# Patient Record
Sex: Male | Born: 1955 | Race: White | Hispanic: No | Smoking: Current every day smoker
Health system: Southern US, Community
[De-identification: ages and names within clinical notes are randomized; demographics above are authoritative.]

## PROBLEM LIST (undated history)

## (undated) DIAGNOSIS — I10 Essential (primary) hypertension: Secondary | ICD-10-CM

## (undated) DIAGNOSIS — R55 Syncope and collapse: Secondary | ICD-10-CM

## (undated) DIAGNOSIS — F1010 Alcohol abuse, uncomplicated: Secondary | ICD-10-CM

## (undated) DIAGNOSIS — E785 Hyperlipidemia, unspecified: Secondary | ICD-10-CM

## (undated) DIAGNOSIS — R569 Unspecified convulsions: Secondary | ICD-10-CM

## (undated) DIAGNOSIS — B019 Varicella without complication: Secondary | ICD-10-CM

## (undated) HISTORY — DX: Hyperlipidemia, unspecified: E78.5

## (undated) HISTORY — DX: Syncope and collapse: R55

## (undated) HISTORY — DX: Varicella without complication: B01.9

## (undated) HISTORY — DX: Essential (primary) hypertension: I10

## (undated) HISTORY — DX: Unspecified convulsions: R56.9

## (undated) HISTORY — DX: Alcohol abuse, uncomplicated: F10.10

---

## 2007-06-03 ENCOUNTER — Other Ambulatory Visit (HOSPITAL_COMMUNITY): Admission: RE | Admit: 2007-06-03 | Discharge: 2007-06-25 | Payer: Self-pay | Admitting: Psychiatry

## 2007-06-04 ENCOUNTER — Ambulatory Visit: Payer: Self-pay | Admitting: Psychiatry

## 2008-01-11 ENCOUNTER — Encounter: Admission: RE | Admit: 2008-01-11 | Discharge: 2008-01-11 | Payer: Self-pay | Admitting: Neurology

## 2008-09-30 ENCOUNTER — Emergency Department (HOSPITAL_COMMUNITY): Admission: EM | Admit: 2008-09-30 | Discharge: 2008-09-30 | Payer: Self-pay | Admitting: Emergency Medicine

## 2008-11-22 ENCOUNTER — Emergency Department (HOSPITAL_COMMUNITY): Admission: EM | Admit: 2008-11-22 | Discharge: 2008-11-22 | Payer: Self-pay | Admitting: Emergency Medicine

## 2009-04-19 ENCOUNTER — Ambulatory Visit (HOSPITAL_COMMUNITY): Admission: RE | Admit: 2009-04-19 | Discharge: 2009-04-20 | Payer: Self-pay | Admitting: Orthopedic Surgery

## 2009-07-14 ENCOUNTER — Ambulatory Visit (HOSPITAL_COMMUNITY): Admission: RE | Admit: 2009-07-14 | Discharge: 2009-07-14 | Payer: Self-pay | Admitting: Orthopedic Surgery

## 2009-09-06 ENCOUNTER — Emergency Department (HOSPITAL_COMMUNITY): Admission: EM | Admit: 2009-09-06 | Discharge: 2009-09-07 | Payer: Self-pay | Admitting: Emergency Medicine

## 2009-09-10 ENCOUNTER — Emergency Department (HOSPITAL_COMMUNITY): Admission: EM | Admit: 2009-09-10 | Discharge: 2009-09-11 | Payer: Self-pay | Admitting: Emergency Medicine

## 2010-08-31 IMAGING — RF DG FOOT 2V*R*
1 series · 2 of 2 positions shown · non-contrast
Comparison: None

CLINICAL DATA: Less frank fracture

RIGHT FOOT - 2 VIEW

[Series 1: run · 2 of 2 slices shown]
[im 1/2]
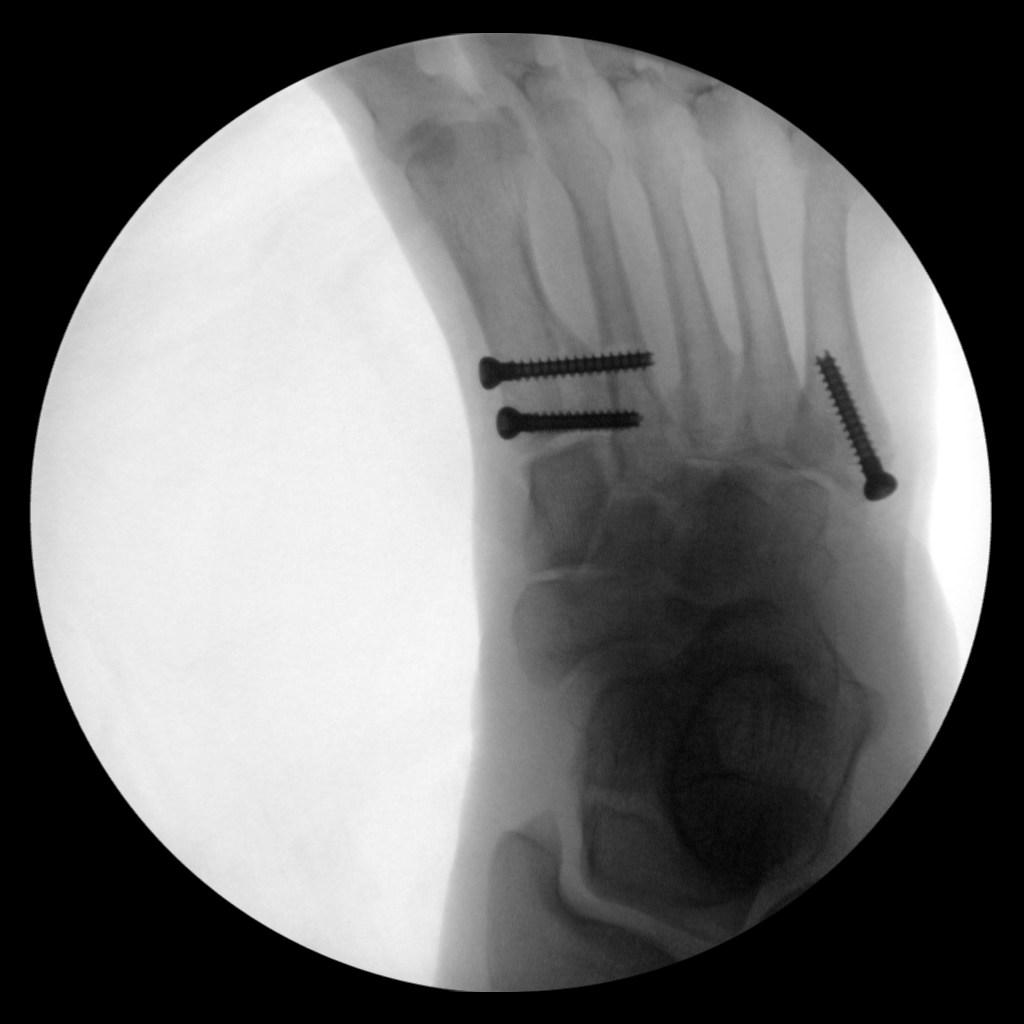
[im 2/2]
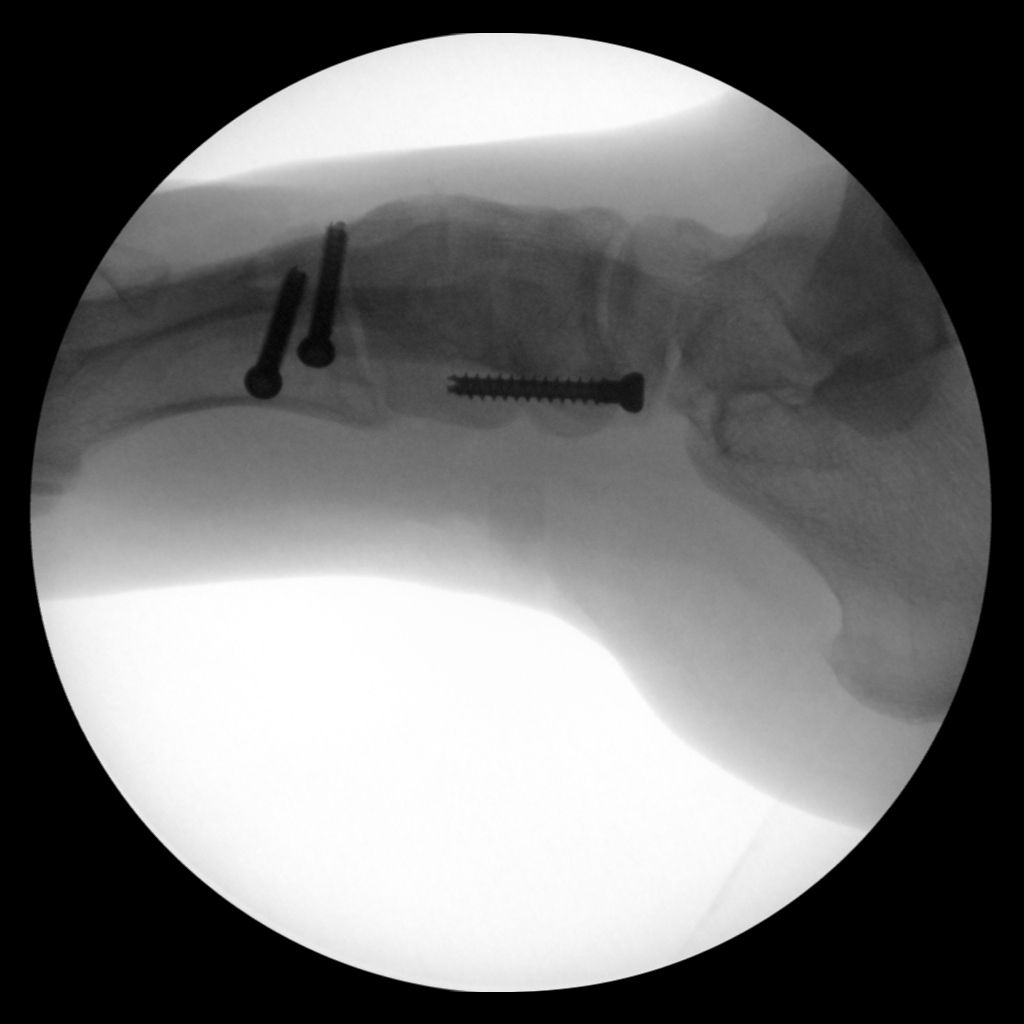

[2 of 2 positions shown; findings below may reference images not displayed]

FINDINGS: There are fixation screws spanning the first and second
metatarsal from medial to lateral.  A third screw enters the base
of the fifth metatarsal.  No evidence of dislocation.
IMPRESSION: Internal screw fixation of less frank fracture dislocation.

## 2010-10-24 LAB — COMPREHENSIVE METABOLIC PANEL
AST: 43 U/L — ABNORMAL HIGH (ref 0–37)
Albumin: 4.5 g/dL (ref 3.5–5.2)
Alkaline Phosphatase: 63 U/L (ref 39–117)
BUN: 8 mg/dL (ref 6–23)
CO2: 24 mEq/L (ref 19–32)
Calcium: 8.3 mg/dL — ABNORMAL LOW (ref 8.4–10.5)
Chloride: 98 mEq/L (ref 96–112)
Creatinine, Ser: 0.57 mg/dL (ref 0.4–1.5)
GFR calc Af Amer: >60 mL/min (ref >60)
GFR calc non Af Amer: >60 mL/min (ref >60)
Glucose, Bld: 102 mg/dL — ABNORMAL HIGH (ref 70–99)
Potassium: 3.2 mEq/L — ABNORMAL LOW (ref 3.5–5.1)
Sodium: 139 mEq/L (ref 135–145)
Total Bilirubin: 1.1 mg/dL (ref 0.3–1.2)
Total Protein: 6.6 g/dL (ref 6.0–8.3)
Total Protein: 8.2 g/dL (ref 6.0–8.3)

## 2010-10-24 LAB — DIFFERENTIAL
Basophils Absolute: 0.1 10*3/uL (ref 0.0–0.1)
Basophils Relative: 1 % (ref 0–1)
Eosinophils Relative: 0 % (ref 0–5)
Lymphocytes Relative: 20 % (ref 12–46)
Lymphocytes Relative: 22 % (ref 12–46)
Lymphs Abs: 1.1 10*3/uL (ref 0.7–4.0)
Monocytes Absolute: 0.6 10*3/uL (ref 0.1–1.0)
Monocytes Relative: 5 % (ref 3–12)
Monocytes Relative: 6 % (ref 3–12)
Neutro Abs: 4.1 10*3/uL (ref 1.7–7.7)
Neutro Abs: 7.1 10*3/uL (ref 1.7–7.7)
Neutrophils Relative %: 74 % (ref 43–77)

## 2010-10-24 LAB — RAPID URINE DRUG SCREEN, HOSP PERFORMED
Amphetamines: NOT DETECTED
Amphetamines: NOT DETECTED
Barbiturates: NOT DETECTED
Barbiturates: NOT DETECTED
Benzodiazepines: NOT DETECTED
Benzodiazepines: POSITIVE — AB
Cocaine: NOT DETECTED
Opiates: NOT DETECTED
Tetrahydrocannabinol: NOT DETECTED
Tetrahydrocannabinol: NOT DETECTED

## 2010-10-24 LAB — CBC
Hemoglobin: 15.2 g/dL (ref 13.0–17.0)
MCHC: 35 g/dL (ref 30.0–36.0)
MCV: 95.1 fL (ref 78.0–100.0)
Platelets: 226 10*3/uL (ref 150–400)
RBC: 4.57 MIL/uL (ref 4.22–5.81)
RDW: 14.8 % (ref 11.5–15.5)
RDW: 15.3 % (ref 11.5–15.5)
WBC: 10 10*3/uL (ref 4.0–10.5)

## 2010-10-24 LAB — POCT I-STAT, CHEM 8
Calcium, Ion: 1.02 mmol/L — ABNORMAL LOW (ref 1.12–1.32)
Chloride: 106 mEq/L (ref 96–112)
Glucose, Bld: 107 mg/dL — ABNORMAL HIGH (ref 70–99)
HCT: 45 % (ref 39.0–52.0)
Hemoglobin: 15.3 g/dL (ref 13.0–17.0)
TCO2: 29 mmol/L (ref 0–100)

## 2010-10-24 LAB — ACETAMINOPHEN LEVEL: Acetaminophen (Tylenol), Serum: <10 ug/mL — ABNORMAL LOW (ref 10–30)

## 2010-10-24 LAB — ETHANOL: Alcohol, Ethyl (B): 117 mg/dL — ABNORMAL HIGH (ref 0–10)

## 2010-11-09 LAB — BASIC METABOLIC PANEL
BUN: 10 mg/dL (ref 6–23)
Creatinine, Ser: 0.7 mg/dL (ref 0.4–1.5)
GFR calc non Af Amer: >60 mL/min (ref >60)
Glucose, Bld: 96 mg/dL (ref 70–99)
Potassium: 3.8 mEq/L (ref 3.5–5.1)

## 2010-11-09 LAB — CBC
HCT: 38.7 % — ABNORMAL LOW (ref 39.0–52.0)
Platelets: 197 10*3/uL (ref 150–400)
RDW: 13.5 % (ref 11.5–15.5)

## 2010-11-09 LAB — POCT I-STAT 4, (NA,K, GLUC, HGB,HCT)
Glucose, Bld: 109 mg/dL — ABNORMAL HIGH (ref 70–99)
Sodium: 138 mEq/L (ref 135–145)

## 2010-11-20 LAB — URINALYSIS, ROUTINE W REFLEX MICROSCOPIC
Glucose, UA: NEGATIVE mg/dL
Hgb urine dipstick: NEGATIVE
Specific Gravity, Urine: 1.02 (ref 1.005–1.030)

## 2010-11-20 LAB — COMPREHENSIVE METABOLIC PANEL
AST: 35 U/L (ref 0–37)
Albumin: 3.9 g/dL (ref 3.5–5.2)
Alkaline Phosphatase: 42 U/L (ref 39–117)
BUN: 13 mg/dL (ref 6–23)
GFR calc Af Amer: >60 mL/min (ref >60)
Potassium: 4.1 mEq/L (ref 3.5–5.1)
Sodium: 140 mEq/L (ref 135–145)
Total Protein: 6.6 g/dL (ref 6.0–8.3)

## 2010-11-20 LAB — CBC
HCT: 45.7 % (ref 39.0–52.0)
Hemoglobin: 15.4 g/dL (ref 13.0–17.0)
MCHC: 33.8 g/dL (ref 30.0–36.0)
MCV: 98.5 fL (ref 78.0–100.0)
RBC: 4.64 MIL/uL (ref 4.22–5.81)

## 2010-11-20 LAB — DIFFERENTIAL
Basophils Relative: 1 % (ref 0–1)
Eosinophils Relative: 1 % (ref 0–5)
Monocytes Absolute: 0.3 10*3/uL (ref 0.1–1.0)
Monocytes Relative: 4 % (ref 3–12)
Neutro Abs: 4.7 10*3/uL (ref 1.7–7.7)

## 2010-11-20 LAB — RAPID URINE DRUG SCREEN, HOSP PERFORMED
Barbiturates: NOT DETECTED
Opiates: NOT DETECTED

## 2010-11-20 LAB — URINE MICROSCOPIC-ADD ON

## 2010-12-17 ENCOUNTER — Encounter: Payer: Self-pay | Admitting: Family Medicine

## 2010-12-17 ENCOUNTER — Ambulatory Visit (INDEPENDENT_AMBULATORY_CARE_PROVIDER_SITE_OTHER): Payer: BC Managed Care – PPO | Admitting: Family Medicine

## 2010-12-17 DIAGNOSIS — M7580 Other shoulder lesions, unspecified shoulder: Secondary | ICD-10-CM

## 2010-12-17 DIAGNOSIS — I10 Essential (primary) hypertension: Secondary | ICD-10-CM

## 2010-12-17 DIAGNOSIS — E785 Hyperlipidemia, unspecified: Secondary | ICD-10-CM | POA: Insufficient documentation

## 2010-12-17 DIAGNOSIS — M719 Bursopathy, unspecified: Secondary | ICD-10-CM

## 2010-12-17 DIAGNOSIS — F1020 Alcohol dependence, uncomplicated: Secondary | ICD-10-CM | POA: Insufficient documentation

## 2010-12-17 NOTE — Patient Instructions (Signed)
Consider icing shoulder 2-3 times daily. Work on range of motion stretches, but no heavy lifting. Touch base in 2 weeks if no better.

## 2010-12-17 NOTE — Progress Notes (Signed)
  Subjective:    Patient ID: James Leblanc, male    DOB: [birth year], 55 y.o.   MRN: [id number]  HPI New patient to establish care. History of alcoholism. Recent 14 month inpatient treatment.    Discharged 2 weeks ago. He is currently back staying with parents. Other medical history significant for prior elevated blood pressure but not treated recently for hypertension. History of hyperlipidemia. History of petit mal seizures in childhood but no adult seizures.  Currently takes no medications. No prior surgeries. No known drug allergy.  Acute issue is right shoulder pain past 5 months. No injury. Did some weight lifting during his rehabilitation. Pain with abduction. No neck pain. No weakness. Minimal if any night pain. Ibuprofen with minimal relief. Did have someone in rehabilitation inject his shoulder several months ago without much improvement.   Review of Systems  Constitutional: Negative for fever, chills, activity change and appetite change.  Respiratory: Negative for cough and shortness of breath.   Cardiovascular: Negative for chest pain and palpitations.  Gastrointestinal: Negative for abdominal pain.  Genitourinary: Negative for dysuria.  Musculoskeletal:       [Right shoulder pain as per history of present illness Neurological: Negative for dizziness, syncope and weakness.  Psychiatric/Behavioral: Negative for dysphoric mood and agitation.       Objective:   Physical Exam  Constitutional: He appears well-developed and well-nourished.  HENT:  Mouth/Throat: Oropharynx is clear and moist. No oropharyngeal exudate.  Eyes: Pupils are equal, round, and reactive to light.  Neck: Neck supple. No thyromegaly present.  Cardiovascular: Normal rate, regular rhythm and normal heart sounds.   Pulmonary/Chest: Effort normal and breath sounds normal. No respiratory distress. He has no wheezes. He has no rales.  Musculoskeletal:       Right shoulder reveals no muscle atrophy. Full range  of motion. No a.c. joint tenderness. No biceps tenderness. Pain with abduction against resistance. No pain with internal rotation. No significant weakness with rotator cuff testing  Lymphadenopathy:    He has no cervical adenopathy.  Psychiatric: He has a normal mood and affect.          Assessment & Plan:  #1 right shoulder pain. Suspect rotator cuff tendinitis. Discussed risk and benefits of corticosteroid injection and patient consented. Prepped right shoulder with Betadine. Using sterile technique injected 40 mg Depo-Medrol and 2 cc plain Xylocaine using posterior lateral approach. Patient tolerated well some immediate relief of pain afterwards. Instructed in range of motion exercises. Consider MRI or orthopedic referral if no better 2 to 3 weeks #2 history of alcohol abuse. Inpatient rehabilitation as above. Offer ongoing support. #3 hx of hyperlipidemia.

## 2011-05-14 LAB — URINE DRUGS OF ABUSE SCREEN W ALC, ROUTINE (REF LAB)
Amphetamine Screen, Ur: NEGATIVE
Amphetamine Screen, Ur: NEGATIVE
Amphetamine Screen, Ur: NEGATIVE
Cocaine Metabolites: NEGATIVE
Marijuana Metabolite: NEGATIVE
Marijuana Metabolite: NEGATIVE
Marijuana Metabolite: NEGATIVE
Methadone: NEGATIVE
Methadone: NEGATIVE
Methadone: NEGATIVE
Propoxyphene: NEGATIVE
Propoxyphene: NEGATIVE
Propoxyphene: NEGATIVE

## 2018-10-06 ENCOUNTER — Emergency Department (HOSPITAL_COMMUNITY): Payer: Self-pay

## 2018-10-06 ENCOUNTER — Observation Stay (HOSPITAL_COMMUNITY)
Admission: EM | Admit: 2018-10-06 | Discharge: 2018-10-07 | Disposition: A | Discharge status: Home / Self Care | Payer: Self-pay | Attending: Internal Medicine | Admitting: Internal Medicine

## 2018-10-06 ENCOUNTER — Encounter (HOSPITAL_COMMUNITY): Payer: Self-pay | Admitting: Emergency Medicine

## 2018-10-06 ENCOUNTER — Other Ambulatory Visit: Payer: Self-pay

## 2018-10-06 DIAGNOSIS — Z8249 Family history of ischemic heart disease and other diseases of the circulatory system: Secondary | ICD-10-CM | POA: Insufficient documentation

## 2018-10-06 DIAGNOSIS — Z79899 Other long term (current) drug therapy: Secondary | ICD-10-CM | POA: Insufficient documentation

## 2018-10-06 DIAGNOSIS — J9801 Acute bronchospasm: Secondary | ICD-10-CM | POA: Insufficient documentation

## 2018-10-06 DIAGNOSIS — X58XXXA Exposure to other specified factors, initial encounter: Secondary | ICD-10-CM | POA: Insufficient documentation

## 2018-10-06 DIAGNOSIS — E785 Hyperlipidemia, unspecified: Secondary | ICD-10-CM | POA: Insufficient documentation

## 2018-10-06 DIAGNOSIS — J40 Bronchitis, not specified as acute or chronic: Secondary | ICD-10-CM | POA: Insufficient documentation

## 2018-10-06 DIAGNOSIS — I10 Essential (primary) hypertension: Secondary | ICD-10-CM | POA: Insufficient documentation

## 2018-10-06 DIAGNOSIS — J209 Acute bronchitis, unspecified: Secondary | ICD-10-CM | POA: Diagnosis present

## 2018-10-06 DIAGNOSIS — E876 Hypokalemia: Secondary | ICD-10-CM | POA: Diagnosis present

## 2018-10-06 DIAGNOSIS — J90 Pleural effusion, not elsewhere classified: Secondary | ICD-10-CM | POA: Insufficient documentation

## 2018-10-06 DIAGNOSIS — F17210 Nicotine dependence, cigarettes, uncomplicated: Secondary | ICD-10-CM | POA: Insufficient documentation

## 2018-10-06 DIAGNOSIS — R0602 Shortness of breath: Secondary | ICD-10-CM

## 2018-10-06 DIAGNOSIS — J189 Pneumonia, unspecified organism: Principal | ICD-10-CM | POA: Insufficient documentation

## 2018-10-06 DIAGNOSIS — S2231XA Fracture of one rib, right side, initial encounter for closed fracture: Secondary | ICD-10-CM | POA: Diagnosis present

## 2018-10-06 LAB — CBC WITH DIFFERENTIAL/PLATELET
ABS IMMATURE GRANULOCYTES: 0.23 10*3/uL — AB (ref 0.00–0.07)
Basophils Absolute: 0.1 10*3/uL (ref 0.0–0.1)
Basophils Relative: 0 %
EOS PCT: 0 %
Eosinophils Absolute: 0.1 10*3/uL (ref 0.0–0.5)
HEMATOCRIT: 42.8 % (ref 39.0–52.0)
HEMOGLOBIN: 14.1 g/dL (ref 13.0–17.0)
Immature Granulocytes: 1 %
LYMPHS ABS: 1.9 10*3/uL (ref 0.7–4.0)
LYMPHS PCT: 9 %
MCH: 29.5 pg (ref 26.0–34.0)
MCHC: 32.9 g/dL (ref 30.0–36.0)
MCV: 89.5 fL (ref 80.0–100.0)
MONO ABS: 1 10*3/uL (ref 0.1–1.0)
Monocytes Relative: 5 %
NEUTROS ABS: 19 10*3/uL — AB (ref 1.7–7.7)
Neutrophils Relative %: 85 %
Platelets: 325 10*3/uL (ref 150–400)
RBC: 4.78 MIL/uL (ref 4.22–5.81)
RDW: 12.4 % (ref 11.5–15.5)
WBC: 22.3 10*3/uL — ABNORMAL HIGH (ref 4.0–10.5)
nRBC: 0 % (ref 0.0–0.2)

## 2018-10-06 LAB — COMPREHENSIVE METABOLIC PANEL
ALBUMIN: 3.8 g/dL (ref 3.5–5.0)
ALK PHOS: 53 U/L (ref 38–126)
ALT: 30 U/L (ref 0–44)
ANION GAP: 10 (ref 5–15)
AST: 31 U/L (ref 15–41)
BILIRUBIN TOTAL: 0.4 mg/dL (ref 0.3–1.2)
BUN: 12 mg/dL (ref 8–23)
CALCIUM: 8.4 mg/dL — AB (ref 8.9–10.3)
CO2: 21 mmol/L — ABNORMAL LOW (ref 22–32)
CREATININE: 0.7 mg/dL (ref 0.61–1.24)
Chloride: 106 mmol/L (ref 98–111)
GFR calc Af Amer: >60 mL/min (ref >60)
GFR calc non Af Amer: >60 mL/min (ref >60)
GLUCOSE: 153 mg/dL — AB (ref 70–99)
Potassium: 3.3 mmol/L — ABNORMAL LOW (ref 3.5–5.1)
SODIUM: 137 mmol/L (ref 135–145)
TOTAL PROTEIN: 7.6 g/dL (ref 6.5–8.1)

## 2018-10-06 LAB — LACTIC ACID, PLASMA: LACTIC ACID, VENOUS: 1.3 mmol/L (ref 0.5–1.9)

## 2018-10-06 LAB — INFLUENZA PANEL BY PCR (TYPE A & B)
INFLAPCR: NEGATIVE
INFLBPCR: NEGATIVE

## 2018-10-06 MED ORDER — SODIUM CHLORIDE 0.9 % IV SOLN
INTRAVENOUS | Status: DC | PRN
  Administered 2018-10-06: via INTRAVENOUS

## 2018-10-06 MED ORDER — OXYCODONE-ACETAMINOPHEN 5-325 MG PO TABS
1.0000 | ORAL_TABLET | Freq: Once | ORAL | Status: AC
  Administered 2018-10-06: 1 via ORAL
  Filled 2018-10-06: qty 1

## 2018-10-06 MED ORDER — LEVOFLOXACIN IN D5W 750 MG/150ML IV SOLN
750.0000 mg | Freq: Once | INTRAVENOUS | Status: AC
  Administered 2018-10-06: 750 mg via INTRAVENOUS
  Filled 2018-10-06: qty 150

## 2018-10-06 MED ORDER — ALBUTEROL SULFATE (2.5 MG/3ML) 0.083% IN NEBU
5.0000 mg | INHALATION_SOLUTION | Freq: Once | RESPIRATORY_TRACT | Status: AC
  Administered 2018-10-06: 5 mg via RESPIRATORY_TRACT
  Filled 2018-10-06: qty 6

## 2018-10-06 NOTE — ED Triage Notes (Signed)
Per EMS pt was coughing tonight when he felt "a pop in is right side." Pt given 6mg  morphine en route via [facility].

## 2018-10-06 NOTE — ED Provider Notes (Signed)
[facility] EMERGENCY DEPARTMENT Provider Note   CSN: [id number] Arrival date & time: 10/06/18  2112    History   Chief Complaint Chief Complaint  Patient presents with  . Rib Pain    HPI James Leblanc is a 63 y.o. male.     HPI With history of several weeks of nonproductive cough.  No fever or chills.  States he began to have acute onset right-sided chest pain after coughing this evening.  Was having difficulty catching his breath.  States he occasionally produces small amount of white sputum.  Right-sided chest pain is worse with deep breathing, coughing and movement.  He has had no new lower extremity swelling or pain.  No recent extended travel or immobilization. Past Medical History:  Diagnosis Date  . Alcohol abuse   . Chicken pox   . Fainting   . Hyperlipidemia   . Hypertension   . Seizures [facility])     Patient Active Problem List   Diagnosis Date Noted  . CAP (community acquired pneumonia) 10/07/2018  . Alcoholism (HCC) 12/17/2010  . Hypertension 12/17/2010  . Hyperlipemia 12/17/2010    History reviewed. No pertinent surgical history.      Home Medications    Prior to Admission medications   Medication Sig Start Date End Date Taking? Authorizing Provider  acetaminophen (TYLENOL) 500 MG tablet Take 500 mg by mouth every 6 (six) hours as needed for mild pain or moderate pain.   Yes [provider]  dextromethorphan-guaiFENesin (ROBITUSSIN COLD COUGH+ CHEST) 10-100 MG/5ML liquid Take 5 mLs by mouth every 4 (four) hours as needed for cough.   Yes [provider]  DM-Doxylamine-Acetaminophen (NYQUIL COLD & FLU PO) Take 15 mLs by mouth at bedtime as needed (for sleep-cough).   Yes [provider]  fish oil-omega-3 fatty acids 1000 MG capsule Take 2 g by mouth daily.     Yes [provider]  glucosamine-chondroitin 500-400 MG tablet Take 1 tablet by mouth daily.     Yes [provider]    Family History Family History   Problem Relation Age of Onset  . Hypertension Father     Social History Social History   Tobacco Use  . Smoking status: Current Every Day Smoker    Packs/day: 0.50    Years: 40.00    Pack years: 20.00    Types: Cigarettes  . Smokeless tobacco: Never Used  Substance Use Topics  . Alcohol use: Not Currently  . Drug use: Not Currently     Allergies   Patient has no known allergies.   Review of Systems Review of Systems  Constitutional: Negative for chills and fever.  HENT: Negative for sore throat and trouble swallowing.   Eyes: Negative for visual disturbance.  Respiratory: Positive for cough, shortness of breath and wheezing.   Cardiovascular: Positive for chest pain. Negative for palpitations and leg swelling.  Gastrointestinal: Negative for abdominal pain, diarrhea, nausea and vomiting.  Genitourinary: Negative for dysuria, flank pain and frequency.  Musculoskeletal: Negative for back pain and neck pain.  Skin: Negative for rash and wound.  Neurological: Positive for light-headedness. Negative for dizziness, weakness, numbness and headaches.  All other systems reviewed and are negative.    Physical Exam Updated Vital Signs BP 126/89   Pulse 86   Resp (!) 21   Wt 95.3 kg   SpO2 92%   BMI 32.89 kg/m   Physical Exam Vitals signs and nursing note reviewed.  Constitutional:  General: He is in acute distress.     Appearance: He is well-developed. He is not diaphoretic.  HENT:     Head: Normocephalic and atraumatic.     Nose: Nose normal. No congestion.     Mouth/Throat:     Mouth: Mucous membranes are moist.     Pharynx: No oropharyngeal exudate or posterior oropharyngeal erythema.  Eyes:     Extraocular Movements: Extraocular movements intact.     Pupils: Pupils are equal, round, and reactive to light.  Neck:     Musculoskeletal: Normal range of motion and neck supple. No neck rigidity or muscular tenderness.  Cardiovascular:     Rate and Rhythm:  Normal rate and regular rhythm.     Heart sounds: No murmur. No friction rub. No gallop.   Pulmonary:     Effort: Pulmonary effort is normal.     Breath sounds: Wheezing present.     Comments: Expiratory wheezing throughout.  Diminished breath sounds in the right base.  Minimal right sided lateral chest wall tenderness to palpation.  No point tenderness. Chest:     Chest wall: Tenderness present.  Abdominal:     General: Bowel sounds are normal. There is no distension.     Palpations: Abdomen is soft.     Tenderness: There is no abdominal tenderness. There is no right CVA tenderness, left CVA tenderness, guarding or rebound.  Musculoskeletal: Normal range of motion.        General: No swelling, tenderness or deformity.     Right lower leg: No edema.     Left lower leg: No edema.     Comments: No midline thoracic lumbar tenderness.  No lower extremity swelling, asymmetry or tenderness.  Distal pulses intact.  Lymphadenopathy:     Cervical: No cervical adenopathy.  Skin:    General: Skin is warm and dry.     Capillary Refill: Capillary refill takes less than 2 seconds.     Findings: No erythema or rash.  Neurological:     General: No focal deficit present.     Mental Status: He is alert and oriented to person, place, and time.     Comments: Moves all extremities without focal deficit.  Sensation intact.  Psychiatric:        Behavior: Behavior normal.      ED Treatments / Results  Labs (all labs ordered are listed, but only abnormal results are displayed) Labs Reviewed  CBC WITH DIFFERENTIAL/PLATELET - Abnormal; Notable for the following components:      Result Value   WBC 22.3 (*)    Neutro Abs 19.0 (*)    Abs Immature Granulocytes 0.23 (*)    All other components within normal limits  COMPREHENSIVE METABOLIC PANEL - Abnormal; Notable for the following components:   Potassium 3.3 (*)    CO2 21 (*)    Glucose, Bld 153 (*)    Calcium 8.4 (*)    All other components within  normal limits  CULTURE, BLOOD (ROUTINE X 2)  CULTURE, BLOOD (ROUTINE X 2)  LACTIC ACID, PLASMA  INFLUENZA PANEL BY PCR (TYPE A & B)    EKG EKG Interpretation  Date/Time:  Tuesday October 06 2018 21:25:14 EST Ventricular Rate:  91 PR Interval:    QRS Duration: 76 QT Interval:  376 QTC Calculation: 463 R Axis:   39 Text Interpretation:  Sinus rhythm Confirmed by Loren Racer ([id number]) on 10/06/2018 10:14:52 PM   Radiology Dg Chest 2 View  Result Date: 10/06/2018 CLINICAL  DATA:  Shortness of breath. Right rib pain. EXAM: CHEST - 2 VIEW COMPARISON:  Chest radiograph 04/19/2009. Concurrent right rib radiographs. FINDINGS: Low lung volumes. Borderline cardiomegaly. Tortuous thoracic aorta. Interstitial and bronchial thickening. There are patchy bibasilar opacities. Small volume pleural fluid on the right. Lower right rib fracture is better assessed on concurrent rib radiographs. No pneumothorax. IMPRESSION: 1. Low lung volumes with interstitial and bronchial thickening, bronchitis versus vascular. 2. Lower right rib fracture, better assessed on concurrent right rib series. Associated small volume right pleural fluid. Patchy bibasilar opacities are likely atelectasis. Electronically Signed   By: Narda Rutherford M.D.   On: 10/06/2018 22:16   Dg Ribs Unilateral Right  Result Date: 10/06/2018 CLINICAL DATA:  Right rib pain. Cough, feeling a pop. EXAM: RIGHT RIBS - 2 VIEW COMPARISON:  Concurrent frontal and lateral views of the chest. FINDINGS: Displaced right lateral tenth rib fracture. Associated small volume right pleural fluid and right basilar opacity. No pneumothorax. IMPRESSION: Displaced right lateral tenth rib fracture. Associated small volume right pleural fluid and right basilar opacity. Electronically Signed   By: Narda Rutherford M.D.   On: 10/06/2018 22:17    Procedures Procedures (including critical care time)  Medications Ordered in ED Medications  levofloxacin (LEVAQUIN) IVPB  750 mg (750 mg Intravenous New Bag/Given 10/06/18 2348)  0.9 %  sodium chloride infusion ( Intravenous New Bag/Given 10/06/18 2348)  oxyCODONE-acetaminophen (PERCOCET/ROXICET) 5-325 MG per tablet 1 tablet (1 tablet Oral Given 10/06/18 2248)  albuterol (PROVENTIL) (2.5 MG/3ML) 0.083% nebulizer solution 5 mg (5 mg Nebulization Given 10/06/18 2259)     Initial Impression / Assessment and Plan / ED Course  I have reviewed the triage vital signs and the nursing notes.  Pertinent labs & imaging results that were available during my care of the patient were reviewed by me and considered in my medical decision making (see chart for details).       Patient has evidence of displaced rib fracture and bilateral infiltrates on chest x-ray.  Elevated white blood cell count but normal lactic acid. Question atelectasis versus bilateral pneumonia.  Patient was given a breathing treatment with some improvement of his wheezing.  Continues to have right-sided pain despite pain medication.  Saturations dropped into the high 80s.  Will start antibiotics for presumed pneumonia.  Discussed with hospitalist who will see patient in the emergency department and admit.  Final Clinical Impressions(s) / ED Diagnoses   Final diagnoses:  Short of breath on exertion  Community acquired pneumonia, unspecified laterality  Closed fracture of one rib of right side, initial encounter    ED Discharge Orders    None       Loren Racer, MD 10/07/18 0003

## 2018-10-06 NOTE — ED Notes (Signed)
Called resp for neb  

## 2018-10-06 NOTE — ED Notes (Signed)
Phleb in room  

## 2018-10-07 ENCOUNTER — Encounter (HOSPITAL_COMMUNITY): Payer: Self-pay | Admitting: Family Medicine

## 2018-10-07 DIAGNOSIS — E876 Hypokalemia: Secondary | ICD-10-CM | POA: Diagnosis present

## 2018-10-07 DIAGNOSIS — J209 Acute bronchitis, unspecified: Secondary | ICD-10-CM | POA: Diagnosis present

## 2018-10-07 DIAGNOSIS — S2231XA Fracture of one rib, right side, initial encounter for closed fracture: Secondary | ICD-10-CM

## 2018-10-07 LAB — CBC WITH DIFFERENTIAL/PLATELET
Abs Immature Granulocytes: 0.25 10*3/uL — ABNORMAL HIGH (ref 0.00–0.07)
Basophils Absolute: 0.1 10*3/uL (ref 0.0–0.1)
Basophils Relative: 1 %
Eosinophils Absolute: 0 10*3/uL (ref 0.0–0.5)
Eosinophils Relative: 0 %
HCT: 39.6 % (ref 39.0–52.0)
Hemoglobin: 12.7 g/dL — ABNORMAL LOW (ref 13.0–17.0)
IMMATURE GRANULOCYTES: 2 %
Lymphocytes Relative: 15 %
Lymphs Abs: 1.8 10*3/uL (ref 0.7–4.0)
MCH: 29.6 pg (ref 26.0–34.0)
MCHC: 32.1 g/dL (ref 30.0–36.0)
MCV: 92.3 fL (ref 80.0–100.0)
Monocytes Absolute: 0.9 10*3/uL (ref 0.1–1.0)
Monocytes Relative: 7 %
Neutro Abs: 9.3 10*3/uL — ABNORMAL HIGH (ref 1.7–7.7)
Neutrophils Relative %: 75 %
Platelets: 330 10*3/uL (ref 150–400)
RBC: 4.29 MIL/uL (ref 4.22–5.81)
RDW: 12.5 % (ref 11.5–15.5)
WBC: 12.4 10*3/uL — ABNORMAL HIGH (ref 4.0–10.5)
nRBC: 0 % (ref 0.0–0.2)

## 2018-10-07 LAB — BASIC METABOLIC PANEL
Anion gap: 8 (ref 5–15)
BUN: 12 mg/dL (ref 8–23)
CHLORIDE: 105 mmol/L (ref 98–111)
CO2: 25 mmol/L (ref 22–32)
Calcium: 8.3 mg/dL — ABNORMAL LOW (ref 8.9–10.3)
Creatinine, Ser: 0.72 mg/dL (ref 0.61–1.24)
GFR calc Af Amer: >60 mL/min (ref >60)
GFR calc non Af Amer: >60 mL/min (ref >60)
Glucose, Bld: 133 mg/dL — ABNORMAL HIGH (ref 70–99)
Potassium: 3.6 mmol/L (ref 3.5–5.1)
Sodium: 138 mmol/L (ref 135–145)

## 2018-10-07 LAB — PROCALCITONIN: Procalcitonin: <0.1 ng/mL

## 2018-10-07 LAB — MAGNESIUM: Magnesium: 2 mg/dL (ref 1.7–2.4)

## 2018-10-07 MED ORDER — LEVOFLOXACIN IN D5W 750 MG/150ML IV SOLN: 750.0000 mg | INTRAVENOUS | Status: DC

## 2018-10-07 MED ORDER — SENNOSIDES-DOCUSATE SODIUM 8.6-50 MG PO TABS
1.0000 | ORAL_TABLET | Freq: Every evening | ORAL | Status: DC | PRN
  Filled 2018-10-07: qty 1

## 2018-10-07 MED ORDER — ONDANSETRON HCL 4 MG/2ML IJ SOLN: 4.0000 mg | Freq: Four times a day (QID) | INTRAMUSCULAR | Status: DC | PRN

## 2018-10-07 MED ORDER — SODIUM CHLORIDE 0.9% FLUSH: 3.0000 mL | INTRAVENOUS | Status: DC | PRN

## 2018-10-07 MED ORDER — ACETAMINOPHEN 325 MG PO TABS: 650.0000 mg | ORAL_TABLET | Freq: Four times a day (QID) | ORAL | Status: DC | PRN

## 2018-10-07 MED ORDER — ACETAMINOPHEN 650 MG RE SUPP: 650.0000 mg | Freq: Four times a day (QID) | RECTAL | Status: DC | PRN

## 2018-10-07 MED ORDER — BISACODYL 5 MG PO TBEC: 5.0000 mg | DELAYED_RELEASE_TABLET | Freq: Every day | ORAL | Status: DC | PRN

## 2018-10-07 MED ORDER — POTASSIUM CHLORIDE CRYS ER 20 MEQ PO TBCR
40.0000 meq | EXTENDED_RELEASE_TABLET | Freq: Once | ORAL | Status: AC
  Administered 2018-10-07: 40 meq via ORAL
  Filled 2018-10-07: qty 2

## 2018-10-07 MED ORDER — ONDANSETRON HCL 4 MG PO TABS: 4.0000 mg | ORAL_TABLET | Freq: Four times a day (QID) | ORAL | Status: DC | PRN

## 2018-10-07 MED ORDER — SODIUM CHLORIDE 0.9% FLUSH
3.0000 mL | Freq: Two times a day (BID) | INTRAVENOUS | Status: DC
  Administered 2018-10-07: 3 mL via INTRAVENOUS

## 2018-10-07 MED ORDER — FAMOTIDINE 20 MG PO TABS: 20.0000 mg | ORAL_TABLET | Freq: Every day | ORAL | 0 refills | Status: AC

## 2018-10-07 MED ORDER — MORPHINE SULFATE (PF) 4 MG/ML IV SOLN: 4.0000 mg | INTRAVENOUS | Status: DC | PRN

## 2018-10-07 MED ORDER — IPRATROPIUM-ALBUTEROL 0.5-2.5 (3) MG/3ML IN SOLN: 3.0000 mL | Freq: Four times a day (QID) | RESPIRATORY_TRACT | Status: DC | PRN

## 2018-10-07 MED ORDER — NICOTINE 14 MG/24HR TD PT24: 14.0000 mg | MEDICATED_PATCH | TRANSDERMAL | 0 refills | Status: AC

## 2018-10-07 MED ORDER — ENOXAPARIN SODIUM 40 MG/0.4ML ~~LOC~~ SOLN
40.0000 mg | SUBCUTANEOUS | Status: DC
  Administered 2018-10-07: 40 mg via SUBCUTANEOUS
  Filled 2018-10-07: qty 0.4

## 2018-10-07 MED ORDER — BENZONATATE 100 MG PO CAPS: 100.0000 mg | ORAL_CAPSULE | Freq: Three times a day (TID) | ORAL | 0 refills | Status: AC | PRN

## 2018-10-07 MED ORDER — FAMOTIDINE 20 MG PO TABS
20.0000 mg | ORAL_TABLET | Freq: Every day | ORAL | Status: DC
  Administered 2018-10-07: 20 mg via ORAL
  Filled 2018-10-07: qty 1

## 2018-10-07 MED ORDER — LEVOFLOXACIN 750 MG PO TABS: 750.0000 mg | ORAL_TABLET | Freq: Every day | ORAL | 0 refills | Status: AC

## 2018-10-07 MED ORDER — GUAIFENESIN-DM 100-10 MG/5ML PO SYRP: 5.0000 mL | ORAL_SOLUTION | ORAL | Status: DC | PRN

## 2018-10-07 MED ORDER — HYDROCODONE-ACETAMINOPHEN 5-325 MG PO TABS
1.0000 | ORAL_TABLET | ORAL | Status: DC | PRN
  Administered 2018-10-07: 1 via ORAL
  Filled 2018-10-07: qty 1

## 2018-10-07 MED ORDER — KETOROLAC TROMETHAMINE 15 MG/ML IJ SOLN
15.0000 mg | Freq: Four times a day (QID) | INTRAMUSCULAR | Status: DC
  Administered 2018-10-07: 15 mg via INTRAVENOUS
  Filled 2018-10-07: qty 1

## 2018-10-07 MED ORDER — NAPROXEN 500 MG PO TBEC: 500.0000 mg | DELAYED_RELEASE_TABLET | Freq: Two times a day (BID) | ORAL | 0 refills | Status: AC

## 2018-10-07 MED ORDER — NICOTINE POLACRILEX 4 MG MT GUM: 4.0000 mg | CHEWING_GUM | OROMUCOSAL | 0 refills | Status: AC | PRN

## 2018-10-07 MED ORDER — SODIUM CHLORIDE 0.9 % IV SOLN: 250.0000 mL | INTRAVENOUS | Status: DC | PRN

## 2018-10-07 NOTE — ED Notes (Signed)
Per Dr. Allena Katz pt okay to be d/c to home once incentive spirometry education done by respiratory.

## 2018-10-07 NOTE — Discharge Instructions (Signed)
Rib Fracture  A rib fracture is a break or crack in one of the bones of the ribs. The ribs are long, curved bones that wrap around your chest and attach to your spine and your breastbone. The ribs protect your heart, lungs, and other organs in the chest. A broken or cracked rib is often painful but is not usually serious. Most rib fractures heal on their own over time. However, rib fractures can be more serious if multiple ribs are broken or if broken ribs move out of place and push against other structures or organs. What are the causes? This condition is caused by:  Repetitive movements with high force, such as pitching a baseball or having severe coughing spells.  A direct blow to the chest, such as a sports injury, a car accident, or a fall.  Cancer that has spread to the bones, which can weaken bones and cause them to break. What are the signs or symptoms? Symptoms of this condition include:  Pain when you breathe in or cough.  Pain when someone presses on the injured area.  Feeling short of breath. How is this diagnosed? This condition is diagnosed with a physical exam and medical history. Imaging tests may also be done, such as:  Chest X-ray.  CT scan.  MRI.  Bone scan.  Chest ultrasound. How is this treated? Treatment for this condition depends on the severity of the fracture. Most rib fractures usually heal on their own in 1-3 months. Sometimes healing takes longer if there is a cough that does not stop or if there are other activities that make the injury worse (aggravating factors). While you heal, you will be given medicines to control the pain. You will also be taught deep breathing exercises. Severe injuries may require hospitalization or surgery. Follow these instructions at home: Managing pain, stiffness, and swelling  If directed, apply ice to the injured area. ? Put ice in a plastic bag. ? Place a towel between your skin and the bag. ? Leave the ice on for  20 minutes, 2-3 times a day.  Take over-the-counter and prescription medicines only as told by your health care provider. Activity  Avoid a lot of activity and any activities or movements that cause pain. Be careful during activities and avoid bumping the injured rib.  Slowly increase your activity as told by your health care provider. General instructions  Do deep breathing exercises as told by your health care provider. This helps prevent pneumonia, which is a common complication of a broken rib. Your health care provider may instruct you to: ? Take deep breaths several times a day. ? Try to cough several times a day, holding a pillow against the injured area. ? Use a device called incentive spirometer to practice deep breathing several times a day.  Drink enough fluid to keep your urine pale yellow.  Do not wear a rib belt or binder. These restrict breathing, which can lead to pneumonia.  Keep all follow-up visits as told by your health care provider. This is important. Contact a health care provider if:  You have a fever. Get help right away if:  You have difficulty breathing or you are short of breath.  You develop a cough that does not stop, or you cough up thick or bloody sputum.  You have nausea, vomiting, or pain in your abdomen.  Your pain gets worse and medicine does not help. Summary  A rib fracture is a break or crack in one of  the bones of the ribs.  A broken or cracked rib is often painful but is not usually serious.  Most rib fractures heal on their own over time.  Treatment for this condition depends on the seve  Shortness of Breath, Adult Shortness of breath means you have trouble breathing. Shortness of breath could be a sign of a medical problem. Follow these instructions at home:  Watch for any changes in your symptoms. Do not use any products that contain nicotine or tobacco, such as cigarettes, e-cigarettes, and chewing tobacco. Do not smoke.  Smoking can cause shortness of breath. If you need help to quit smoking, ask your doctor. Avoid things that can make it harder to breathe, such as: Mold. Dust. Air pollution. Chemical smells. Things that can cause allergy symptoms (allergens), if you have allergies. Keep your living space clean. Use products that help remove mold and dust. Rest as needed. Slowly return to your normal activities. Take over-the-counter and prescription medicines only as told by your doctor. This includes oxygen therapy and inhaled medicines. Keep all follow-up visits as told by your doctor. This is important. Contact a doctor if: Your condition does not get better as soon as expected. You have a hard time doing your normal activities, even after you rest. You have new symptoms. Get help right away if: Your shortness of breath gets worse. You have trouble breathing when you are resting. You feel light-headed or you pass out (faint). You have a cough that is not helped by medicines. You cough up blood. You have pain with breathing. You have pain in your chest, arms, shoulders, or belly (abdomen). You have a fever. You cannot walk up stairs. You cannot exercise the way you normally do. These symptoms may represent a serious problem that is an emergency. Do not wait to see if the symptoms will go away. Get medical help right away. Call your local emergency services (911 in the [place] Do not drive yourself to the hospital. Summary Shortness of breath is when you have trouble breathing enough air. It can be a sign of a medical problem. Avoid things that make it hard for you to breathe, such as smoking, pollution, mold, and dust. Watch for any changes in your symptoms. Contact your doctor if you do not get better or you get worse. This information is not intended to replace advice given to you by your health care provider. Make sure you discuss any questions you have with your health care provider. Document  Released: 01/08/2008 Document Revised: 12/22/2017 Document Reviewed: 12/22/2017 Elsevier Interactive Patient Education  2019 [employer].  rity of the fracture.  Avoid a lot of activity and any activities or movements that cause pain. This information is not intended to replace advice given to you by your health care provider. Make sure you discuss any questions you have with your health care provider. Document Released: 07/22/2005 Document Revised: 10/21/2016 Document Reviewed: 10/21/2016 Elsevier Interactive Patient Education  2019 [employer].

## 2018-10-07 NOTE — ED Notes (Signed)
Dr. Patel at bedside 

## 2018-10-07 NOTE — ED Notes (Signed)
RT at bedside. Incentive spirometer given to patient with understanding of instructions

## 2018-10-07 NOTE — ED Notes (Signed)
Pt ambulated to bathroom without assistance. Minimal pain. Steady gait

## 2018-10-07 NOTE — H&P (Signed)
History and Physical    CANYON LANGSTON HFG:[id number] DOB: 08-Jun-1956 DOA: 10/06/2018  PCP: Patient, No Pcp Per   Patient coming from: Home   Chief Complaint: Cough, SOB, severe right lower chest pain with cough or deep breath   HPI: James Leblanc is a 63 y.o. male with medical history significant for tobacco abuse and history of alcoholism now maintaining abstinence from alcohol for 9 years, presenting to the emergency department for evaluation of productive cough, shortness of breath, and severe pain in the lower right chest with deep cough or breathing.  Patient reports that he developed some upper respiratory symptoms and nonproductive cough a little over a week ago, and as the upper respiratory symptoms began to resolve over the past few days, he has developed worsening productive cough and shortness of breath.  He sneezed this evening just prior to arrival in the ED, felt a "pop" and then severe pain in his right lower chest, thought that he pulled a muscle, but has gone on to have severe pain, much worse with any coughing or deep inspiration, and presents the ED for evaluation of this.  Denies any fevers or chills.  Reports that his father had had some respiratory illness recently.  No travel.  ED Course: Upon arrival to the ED, patient is found to be afebrile, saturating low 90s while at rest on room air, slightly tachypneic, and with vitals otherwise normal.  EKG features a sinus rhythm and chest x-ray with dedicated rib films is notable for displaced right lateral 10th rib fracture with small right pleural effusion, interstitial and bronchial thickening, and patchy opacities in the bases.  Chemistry panel is notable for potassium 3.3 and bicarbonate of 21.  CBC features a leukocytosis to 22,300.  Influenza PCR is negative.  Lactic acid is reassuring at 1.3.  Blood cultures were collected and the patient was treated with morphine, Percocet, Levaquin, and albuterol in the ED.  He continues to  have severe pain with deep breath or inspiration, and he will be observed for ongoing evaluation and management.  Review of Systems:  All other systems reviewed and apart from HPI, are negative.  Past Medical History:  Diagnosis Date  . Alcohol abuse   . Chicken pox   . Fainting   . Hyperlipidemia   . Hypertension   . Seizures (HCC)     History reviewed. No pertinent surgical history.   reports that he has been smoking cigarettes. He has a 20.00 pack-year smoking history. He has never used smokeless tobacco. He reports previous alcohol use. He reports previous drug use.  No Known Allergies  Family History  Problem Relation Age of Onset  . Hypertension Father      Prior to Admission medications   Medication Sig Start Date End Date Taking? Authorizing Provider  acetaminophen (TYLENOL) 500 MG tablet Take 500 mg by mouth every 6 (six) hours as needed for mild pain or moderate pain.   Yes [provider]  dextromethorphan-guaiFENesin (ROBITUSSIN COLD COUGH+ CHEST) 10-100 MG/5ML liquid Take 5 mLs by mouth every 4 (four) hours as needed for cough.   Yes [provider]  DM-Doxylamine-Acetaminophen (NYQUIL COLD & FLU PO) Take 15 mLs by mouth at bedtime as needed (for sleep-cough).   Yes [provider]  fish oil-omega-3 fatty acids 1000 MG capsule Take 2 g by mouth daily.     Yes [provider]  glucosamine-chondroitin 500-400 MG tablet Take 1 tablet by mouth daily.  Yes [provider]    Physical Exam: Vitals:   10/06/18 2117 10/06/18 2130 10/06/18 2259  BP:  126/89   Pulse:  91 86  Resp:  (!) 21   SpO2:  93% 92%  Weight: 95.3 kg      Constitutional: NAD, appears uncomfortable  Eyes: PERTLA, lids and conjunctivae normal ENMT: Mucous membranes are moist. Posterior pharynx clear of any exudate or lesions.   Neck: normal, supple, no masses, no thyromegaly Respiratory: Scattered rhonchi. Speaking full sentences. No accessory  muscle use.  Cardiovascular: S1 & S2 heard, regular rate and rhythm. No extremity edema.   Abdomen: No distension, no tenderness, soft. Bowel sounds normal.  Musculoskeletal: no clubbing / cyanosis. No joint deformity upper and lower extremities.    Skin: no significant rashes, lesions, ulcers. Warm, dry, well-perfused. Neurologic: CN 2-12 grossly intact. Sensation intact. Strength 5/5 in all 4 limbs.  Psychiatric: Alert and oriented x 3. Pleasant and cooperative.    Labs on Admission: I have personally reviewed following labs and imaging studies  CBC: Recent Labs  Lab 10/06/18 2231  WBC 22.3*  NEUTROABS 19.0*  HGB 14.1  HCT 42.8  MCV 89.5  PLT 325   Basic Metabolic Panel: Recent Labs  Lab 10/06/18 2231  NA 137  K 3.3*  CL 106  CO2 21*  GLUCOSE 153*  BUN 12  CREATININE 0.70  CALCIUM 8.4*   GFR: CrCl cannot be calculated (Unknown ideal weight.). Liver Function Tests: Recent Labs  Lab 10/06/18 2231  AST 31  ALT 30  ALKPHOS 53  BILITOT 0.4  PROT 7.6  ALBUMIN 3.8   No results for input(s): LIPASE, AMYLASE in the last 168 hours. No results for input(s): AMMONIA in the last 168 hours. Coagulation Profile: No results for input(s): INR, PROTIME in the last 168 hours. Cardiac Enzymes: No results for input(s): CKTOTAL, CKMB, CKMBINDEX, TROPONINI in the last 168 hours. BNP (last 3 results) No results for input(s): PROBNP in the last 8760 hours. HbA1C: No results for input(s): HGBA1C in the last 72 hours. CBG: No results for input(s): GLUCAP in the last 168 hours. Lipid Profile: No results for input(s): CHOL, HDL, LDLCALC, TRIG, CHOLHDL, LDLDIRECT in the last 72 hours. Thyroid Function Tests: No results for input(s): TSH, T4TOTAL, FREET4, T3FREE, THYROIDAB in the last 72 hours. Anemia Panel: No results for input(s): VITAMINB12, FOLATE, FERRITIN, TIBC, IRON, RETICCTPCT in the last 72 hours. Urine analysis:    Component Value Date/Time   COLORURINE YELLOW  09/30/2008 1451   APPEARANCEUR CLOUDY (A) 09/30/2008 1451   LABSPEC 1.020 09/30/2008 1451   PHURINE 5.5 09/30/2008 1451   GLUCOSEU NEGATIVE 09/30/2008 1451   HGBUR NEGATIVE 09/30/2008 1451   BILIRUBINUR NEGATIVE 09/30/2008 1451   KETONESUR NEGATIVE 09/30/2008 1451   PROTEINUR 30 (A) 09/30/2008 1451   UROBILINOGEN 1.0 09/30/2008 1451   NITRITE NEGATIVE 09/30/2008 1451   LEUKOCYTESUR NEGATIVE 09/30/2008 1451   Sepsis Labs: (procalcitonin:4,lacticidven:4) ) Recent Results (from the past 240 hour(s))  Culture, blood (Routine X 2) w Reflex to ID Panel     Status: None (Preliminary result)   Collection Time: 10/06/18 10:31 PM  Result Value Ref Range Status   Specimen Description BLOOD RIGHT ARM  Final   Special Requests   Final    BOTTLES DRAWN AEROBIC AND ANAEROBIC Blood Culture adequate volume Performed at [facility], [address], [place], [place] [address]    Culture PENDING  Incomplete   Report Status PENDING  Incomplete  Culture, blood (Routine  X 2) w Reflex to ID Panel     Status: None (Preliminary result)   Collection Time: 10/06/18 10:34 PM  Result Value Ref Range Status   Specimen Description LEFT ANTECUBITAL  Final   Special Requests   Final    BOTTLES DRAWN AEROBIC AND ANAEROBIC Blood Culture adequate volume Performed at [facility], [address], [place], [place] [address]    Culture PENDING  Incomplete   Report Status PENDING  Incomplete     Radiological Exams on Admission: Dg Chest 2 View  Result Date: 10/06/2018 CLINICAL DATA:  Shortness of breath. Right rib pain. EXAM: CHEST - 2 VIEW COMPARISON:  Chest radiograph 04/19/2009. Concurrent right rib radiographs. FINDINGS: Low lung volumes. Borderline cardiomegaly. Tortuous thoracic aorta. Interstitial and bronchial thickening. There are patchy bibasilar opacities. Small volume pleural fluid on the right. Lower right rib fracture is better assessed on concurrent rib radiographs. No pneumothorax.  IMPRESSION: 1. Low lung volumes with interstitial and bronchial thickening, bronchitis versus vascular. 2. Lower right rib fracture, better assessed on concurrent right rib series. Associated small volume right pleural fluid. Patchy bibasilar opacities are likely atelectasis. Electronically Signed   By: Narda Rutherford M.D.   On: 10/06/2018 22:16   Dg Ribs Unilateral Right  Result Date: 10/06/2018 CLINICAL DATA:  Right rib pain. Cough, feeling a pop. EXAM: RIGHT RIBS - 2 VIEW COMPARISON:  Concurrent frontal and lateral views of the chest. FINDINGS: Displaced right lateral tenth rib fracture. Associated small volume right pleural fluid and right basilar opacity. No pneumothorax. IMPRESSION: Displaced right lateral tenth rib fracture. Associated small volume right pleural fluid and right basilar opacity. Electronically Signed   By: Narda Rutherford M.D.   On: 10/06/2018 22:17    EKG: Independently reviewed. Sinus rhythm.   Assessment/Plan   1. Rib fracture  - Presents with several days of cough and SOB, felt a "pop" in his right side when he sneezed, and now presents for evaluation of severe right lower chest pain with cough or deep breath  - He is found to have acute fracture involving 10th rib on the right without PTX  - Continue pain-control, incentive spirometry   2. Bronchitis with bronchospasm  - Presents with worsening productive cough and dyspnea, and reportedly had significant wheezing prior to neb treatment in ED   - CXR with interstitial and bronchial thickening, as well as patchy opacities in lower lobes  - Blood cultures were collected in ED and he was treated with albuterol and Levaquin  - Check sputum culture and procalcitonin, continue Levaquin for now, and continue as-needed nebs    3. Hypokalemia  - Serum potassium is 3.2 on admission  - Replaced with oral potassium  - Repeat chem panel in am     DVT prophylaxis: Lovenox Code Status: Full  Family Communication: Discussed  with patient  Consults called: None Admission status: Observation     Briscoe Deutscher, MD [facility] Pager [phone]  If 7PM-7AM, please contact night-coverage [email or web address] Password [email or web address]  10/07/2018, 1:02 AM

## 2018-10-08 LAB — HIV ANTIBODY (ROUTINE TESTING W REFLEX): HIV SCREEN 4TH GENERATION: NONREACTIVE

## 2018-10-11 LAB — CULTURE, BLOOD (ROUTINE X 2)
CULTURE: NO GROWTH
Culture: NO GROWTH
Special Requests: ADEQUATE
Special Requests: ADEQUATE

## 2019-05-14 ENCOUNTER — Other Ambulatory Visit: Payer: Self-pay

## 2019-05-14 DIAGNOSIS — Z20822 Contact with and (suspected) exposure to covid-19: Secondary | ICD-10-CM

## 2019-05-16 LAB — NOVEL CORONAVIRUS, NAA: SARS-CoV-2, NAA: NOT DETECTED

## 2019-12-01 ENCOUNTER — Emergency Department (HOSPITAL_COMMUNITY): Admission: EM | Admit: 2019-12-01 | Discharge: 2019-12-01 | Discharge status: Absent without leave | Payer: Self-pay

## 2020-02-17 IMAGING — DX DG RIBS 2V*R*
5 series · 5 of 5 positions shown · non-contrast
Comparison: Concurrent frontal and lateral views of the chest.

CLINICAL DATA: Right rib pain. Cough, feeling a pop.

EXAM:
RIGHT RIBS - 2 VIEW

[rib pa (1 of 2)]
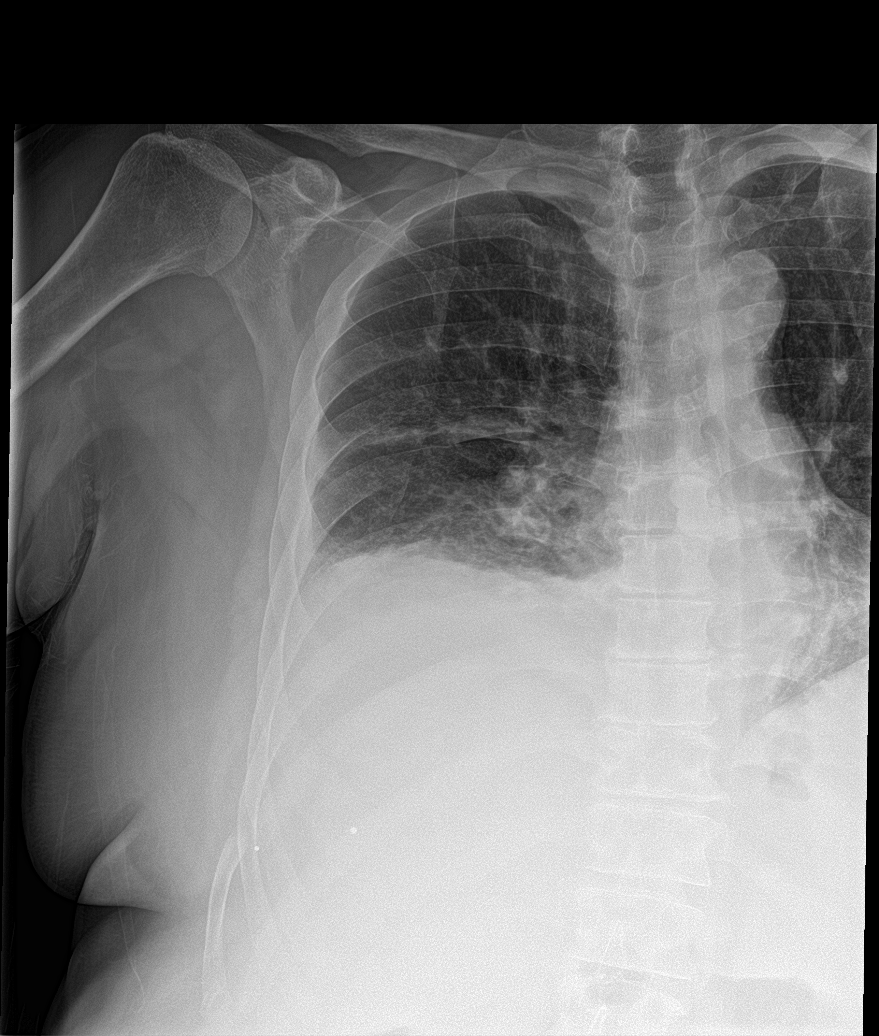

[rib pa obl (1 of 3)]
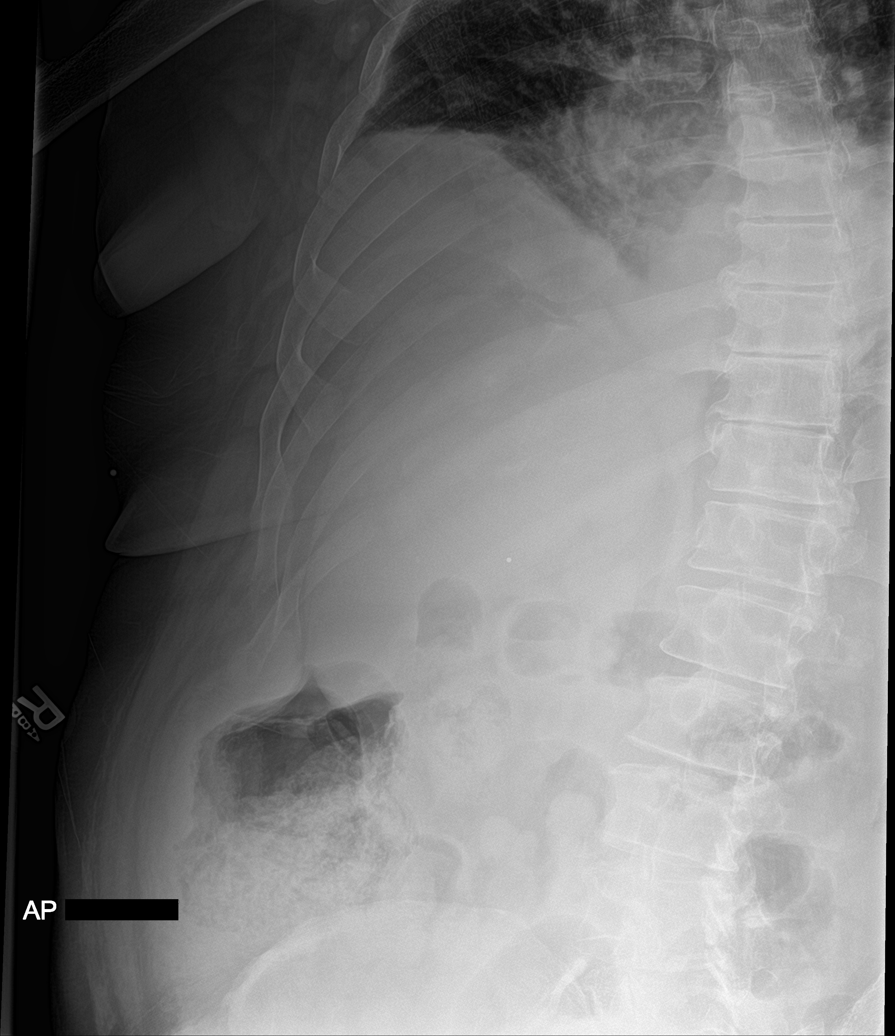

[rib pa obl (2 of 3)]
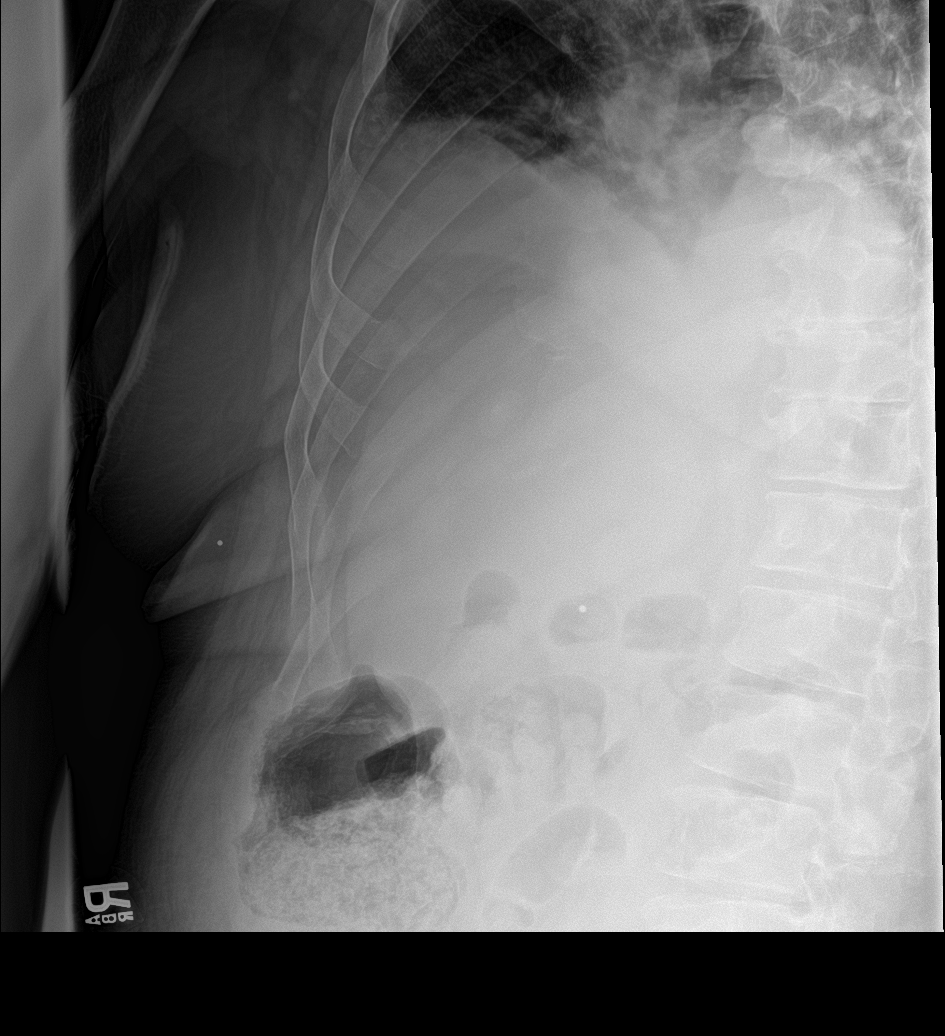

[rib pa (2 of 2)]
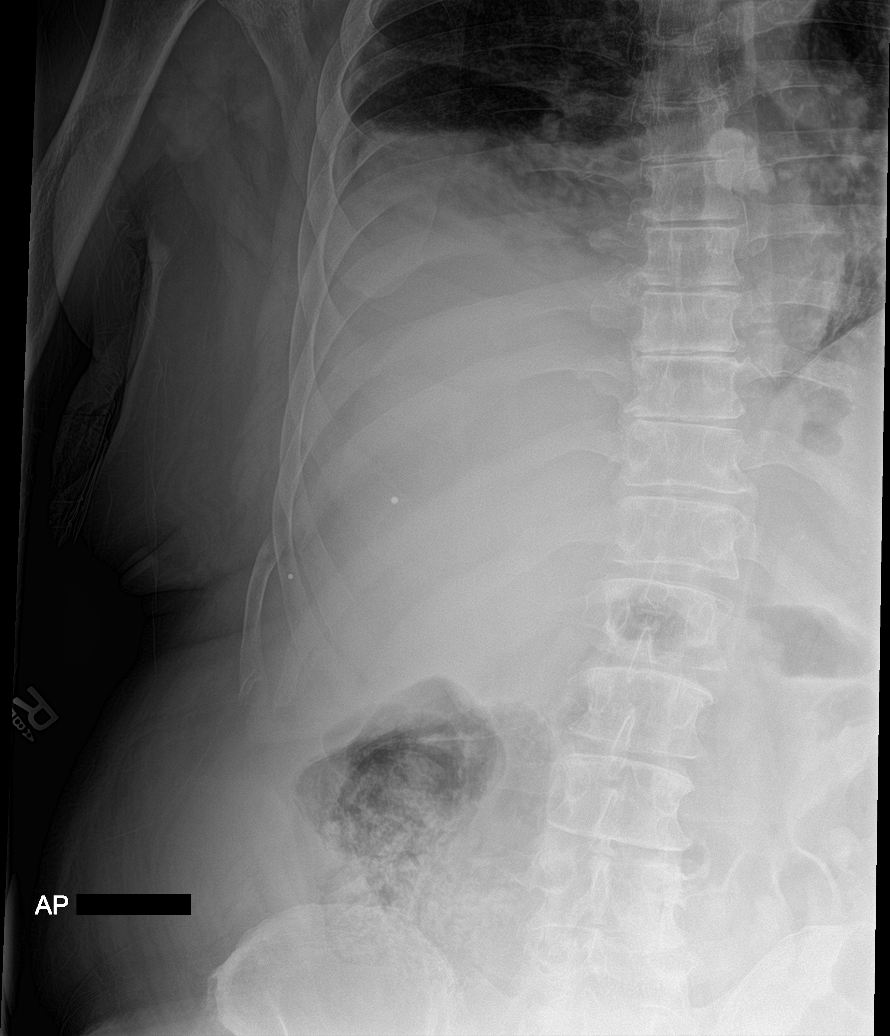

[rib pa obl (3 of 3)]
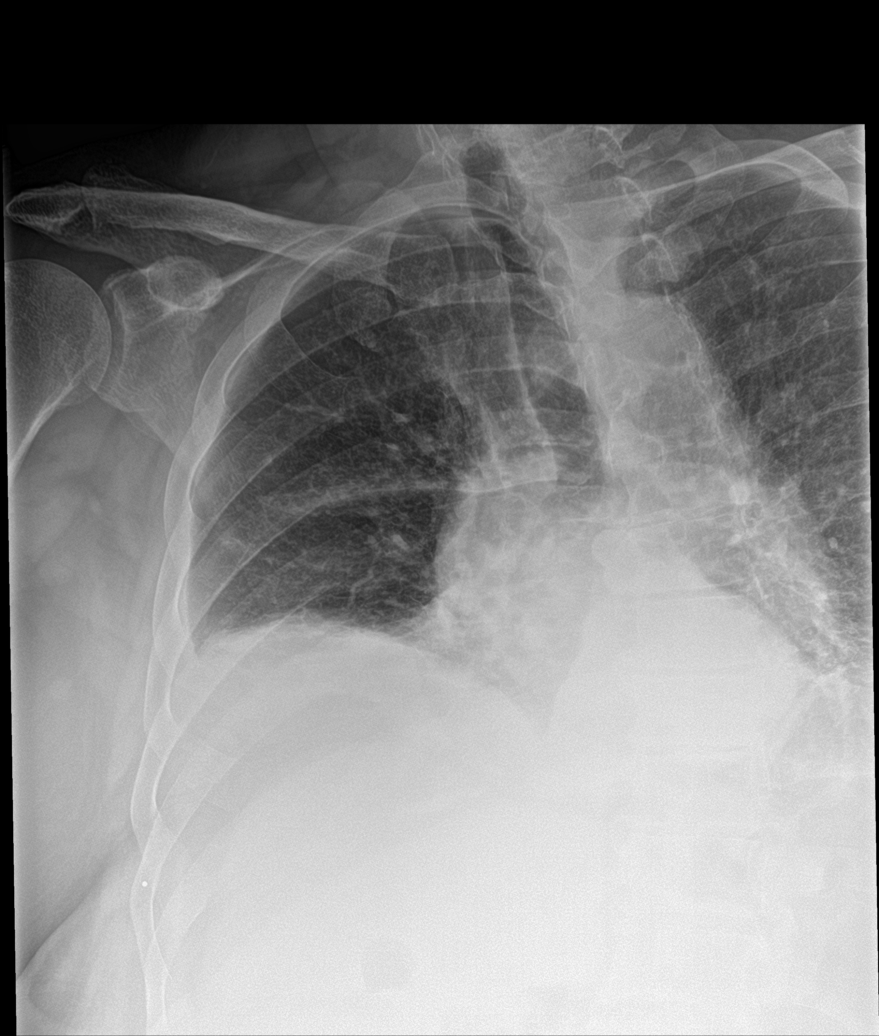

[5 of 5 positions shown; findings below may reference images not displayed]

FINDINGS: Displaced right lateral tenth rib fracture. Associated small volume
right pleural fluid and right basilar opacity. No pneumothorax.
IMPRESSION: Displaced right lateral tenth rib fracture. Associated small volume
right pleural fluid and right basilar opacity.

## 2020-02-17 IMAGING — DX DG CHEST 2V
2 series · 2 of 2 positions shown · non-contrast
Comparison: Chest radiograph 04/19/2009. Concurrent right rib
radiographs.

CLINICAL DATA: Shortness of breath. Right rib pain.

EXAM:
CHEST - 2 VIEW

[chest pa]
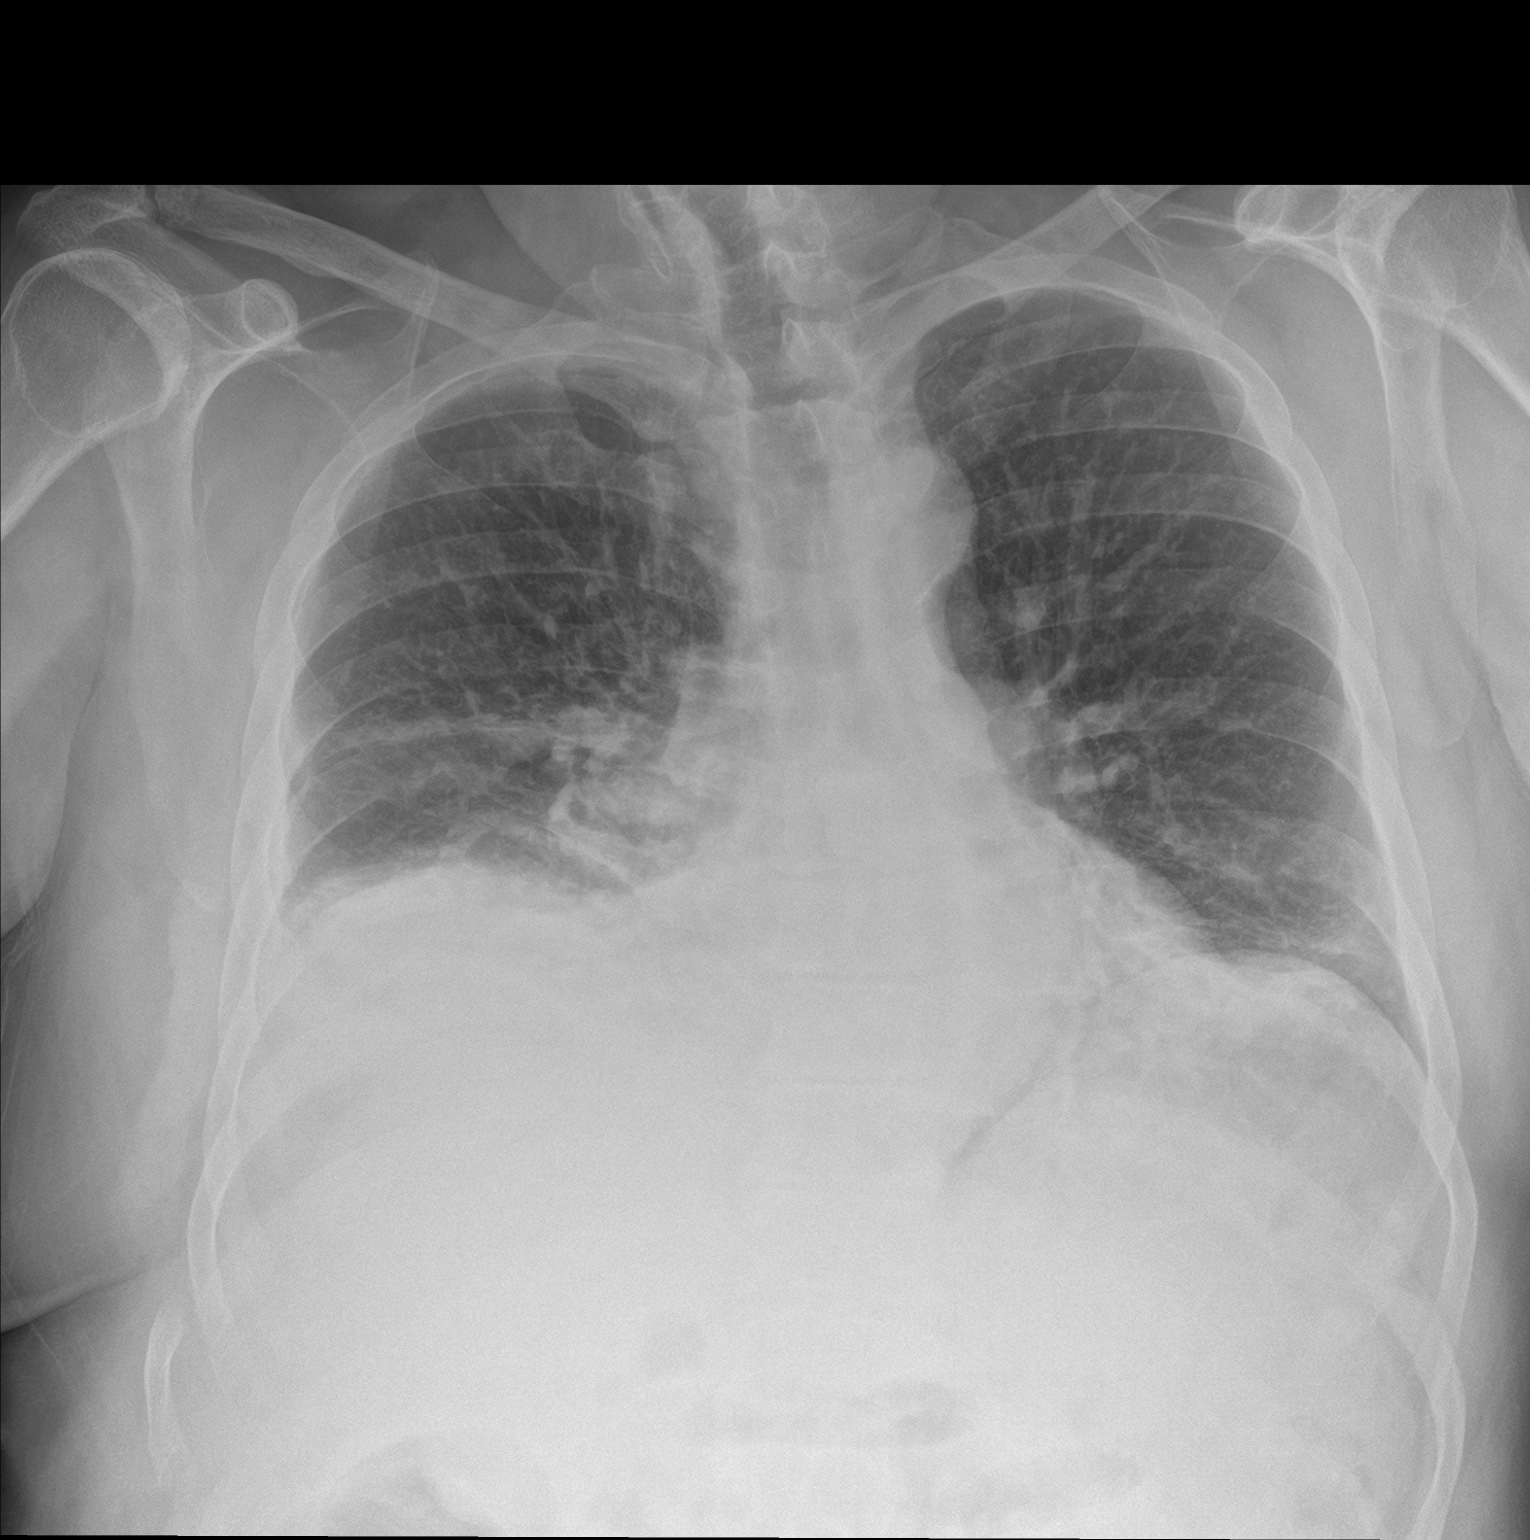

[chest lat]
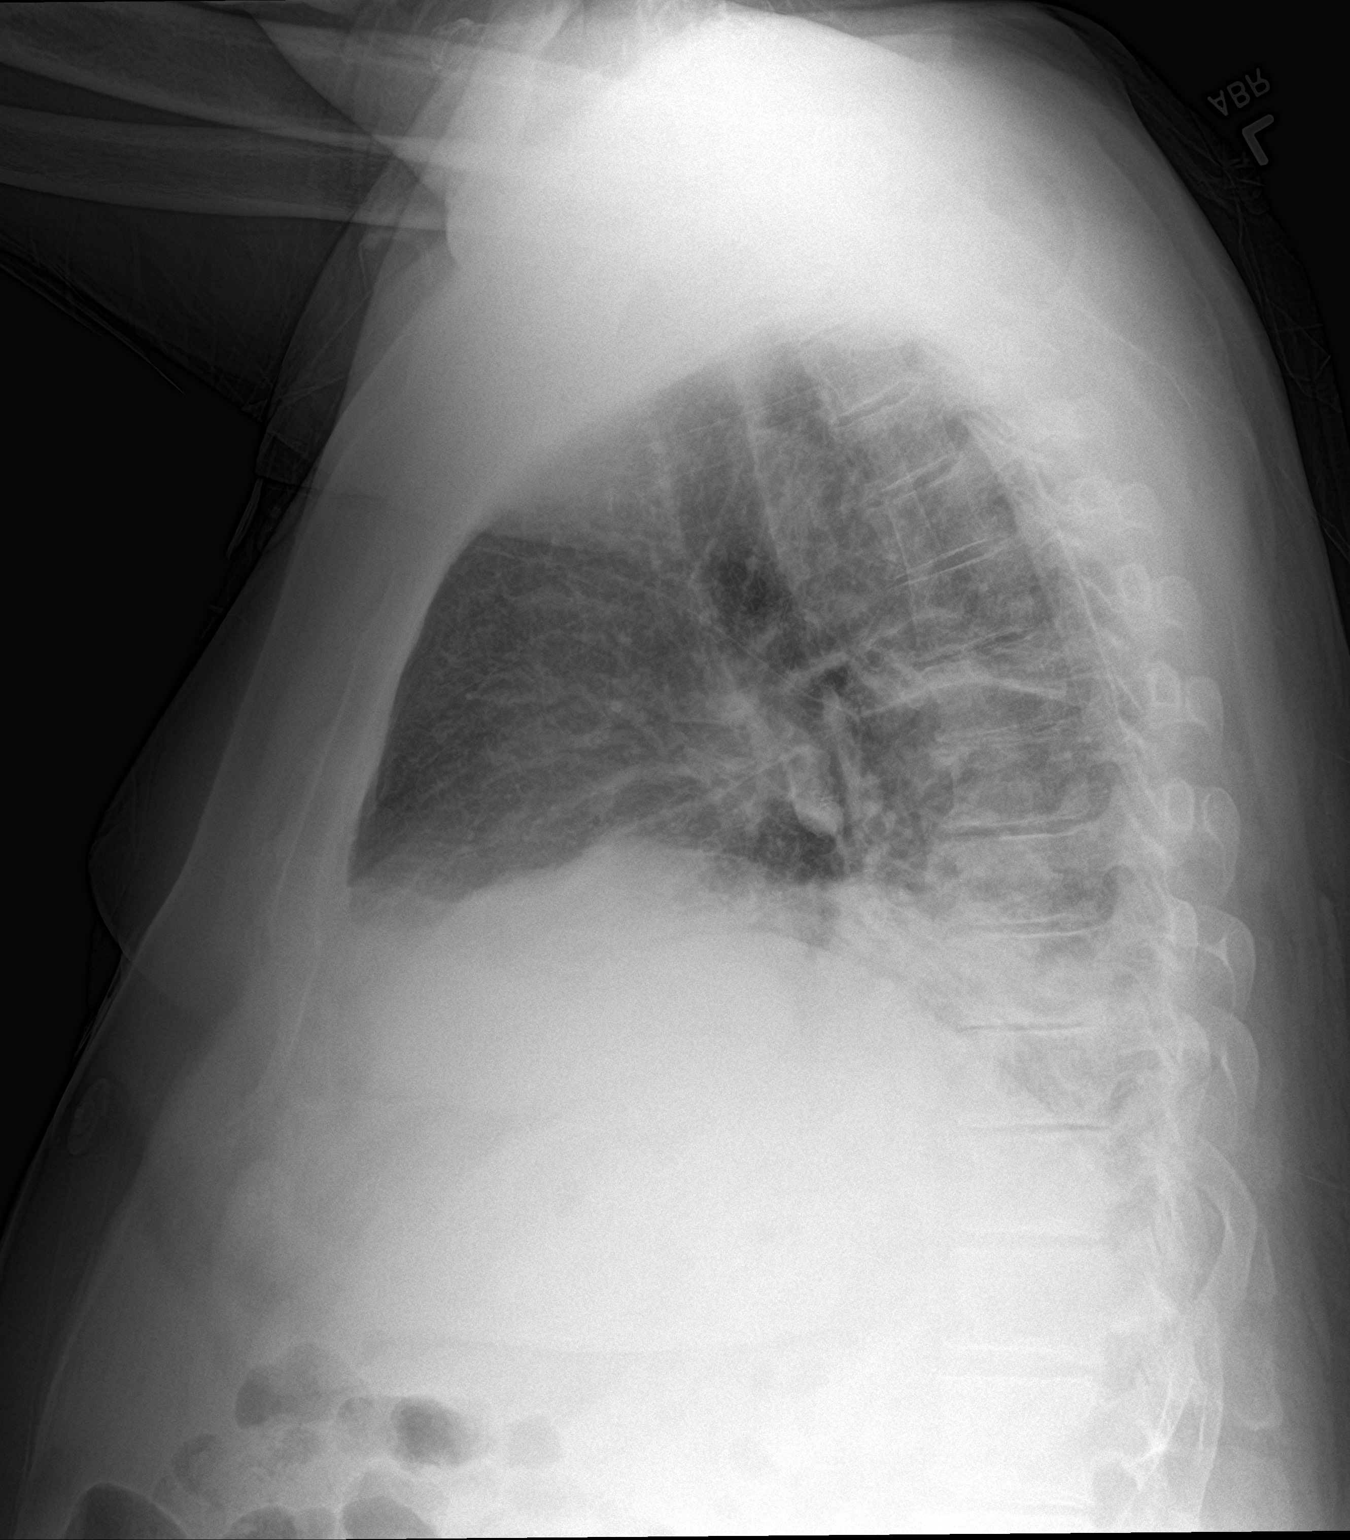

[2 of 2 positions shown; findings below may reference images not displayed]

FINDINGS: Low lung volumes. Borderline cardiomegaly. Tortuous thoracic aorta.
Interstitial and bronchial thickening. There are patchy bibasilar
opacities. Small volume pleural fluid on the right. Lower right rib
fracture is better assessed on concurrent rib radiographs. No
pneumothorax.
IMPRESSION: 1. Low lung volumes with interstitial and bronchial thickening,
bronchitis versus vascular.
2. Lower right rib fracture, better assessed on concurrent right rib
series. Associated small volume right pleural fluid. Patchy
bibasilar opacities are likely atelectasis.

## 2024-05-27 ENCOUNTER — Other Ambulatory Visit (HOSPITAL_COMMUNITY): Payer: Self-pay
